# Patient Record
Sex: Male | Born: 2000 | Race: White | Hispanic: No | Marital: Single | Smoking: Never smoker
Health system: Southern US, Community
[De-identification: ages and names within clinical notes are randomized; demographics above are authoritative.]

## PROBLEM LIST (undated history)

## (undated) HISTORY — PX: TONSILLECTOMY: SUR1361

---

## 2021-03-25 ENCOUNTER — Emergency Department (HOSPITAL_COMMUNITY): Payer: No Typology Code available for payment source

## 2021-03-25 ENCOUNTER — Emergency Department (HOSPITAL_COMMUNITY)
Admission: EM | Admit: 2021-03-25 | Discharge: 2021-03-26 | Disposition: A | Discharge status: Home / Self Care | Payer: No Typology Code available for payment source | Attending: Emergency Medicine | Admitting: Emergency Medicine

## 2021-03-25 ENCOUNTER — Other Ambulatory Visit: Payer: Self-pay

## 2021-03-25 ENCOUNTER — Encounter (HOSPITAL_COMMUNITY): Payer: Self-pay | Admitting: Emergency Medicine

## 2021-03-25 DIAGNOSIS — N50812 Left testicular pain: Secondary | ICD-10-CM | POA: Diagnosis not present

## 2021-03-25 DIAGNOSIS — R1030 Lower abdominal pain, unspecified: Secondary | ICD-10-CM | POA: Insufficient documentation

## 2021-03-25 NOTE — ED Provider Notes (Signed)
Emergency Medicine Provider Triage Evaluation Note  Jesse Rose , a 20 y.o. male  was evaluated in triage.  Pt complains of groin and flank pain.  The patient reports worsening groin and flank pain over the last week.  Reports that he had a history of similar several weeks ago and was diagnosed with epididymitis and completed antibiotics.  His symptoms improved, but never fully resolved before they began about a week ago.  Due to worsening symptoms, he went to urgent care tonight and was advised to come to the ER for further work-up and evaluation.  No fever, chills, vomiting, scrotal redness, difficulty urinating, penile discharge.  Review of Systems  Positive: Scrotal pain, flank pain Negative: Fever, chills, difficulty urinating, scrotal redness, vomiting, penile discharge, chest pain, shortness of breath, diarrhea  Physical Exam  BP 122/82   Pulse 70   Temp 97.8 F (36.6 C) (Oral)   Resp 18   Ht 5\' 11"  (1.803 m)   Wt 88.5 kg   SpO2 100%   BMI 27.20 kg/m  Gen:   Awake, no distress   Resp:  Normal effort  MSK:   Moves extremities without difficulty  Other:  Abdomen soft, nontender, nondistended.  No CVA tenderness bilaterally  Medical Decision Making  Medically screening exam initiated at 10:52 PM.  Appropriate orders placed.  Ozil Stettler was informed that the remainder of the evaluation will be completed by another provider, this initial triage assessment does not replace that evaluation, and the importance of remaining in the ED until their evaluation is complete.  Patient presenting with flank and groin pain for the last week.  He has a history of epididymitis.  Patient's labs and imaging have been ordered in the emergency department.  He will require further work-up and evaluation in the ED.   Durward Mallard, PA-C 03/25/21 2310    05/25/21, MD 03/25/21 [id number]

## 2021-03-25 NOTE — ED Triage Notes (Signed)
Pt here for groin pain and R side pain. Pt had this happen 3 weeks ago, was on antibiotics, pain completely went away. Pain started again a week ago. Pt went to UC today, who told him to come to ER to rule out torsion.

## 2021-03-26 LAB — URINALYSIS, ROUTINE W REFLEX MICROSCOPIC
Bilirubin Urine: NEGATIVE
Glucose, UA: NEGATIVE mg/dL
Hgb urine dipstick: NEGATIVE
Ketones, ur: NEGATIVE mg/dL
Leukocytes,Ua: NEGATIVE
Nitrite: NEGATIVE
Protein, ur: NEGATIVE mg/dL
Specific Gravity, Urine: 1.015 (ref 1.005–1.030)
pH: 6 (ref 5.0–8.0)

## 2021-03-26 LAB — CBC WITH DIFFERENTIAL/PLATELET
Abs Immature Granulocytes: 0.02 10*3/uL (ref 0.00–0.07)
Basophils Absolute: 0 10*3/uL (ref 0.0–0.1)
Basophils Relative: 0 %
Eosinophils Absolute: 0.1 10*3/uL (ref 0.0–0.5)
Eosinophils Relative: 1 %
HCT: 48.1 % (ref 39.0–52.0)
Hemoglobin: 16.5 g/dL (ref 13.0–17.0)
Immature Granulocytes: 0 %
Lymphocytes Relative: 27 %
Lymphs Abs: 1.9 10*3/uL (ref 0.7–4.0)
MCH: 31.5 pg (ref 26.0–34.0)
MCHC: 34.3 g/dL (ref 30.0–36.0)
MCV: 92 fL (ref 80.0–100.0)
Monocytes Absolute: 0.7 10*3/uL (ref 0.1–1.0)
Monocytes Relative: 9 %
Neutro Abs: 4.4 10*3/uL (ref 1.7–7.7)
Neutrophils Relative %: 63 %
Platelets: 268 10*3/uL (ref 150–400)
RBC: 5.23 MIL/uL (ref 4.22–5.81)
RDW: 12 % (ref 11.5–15.5)
WBC: 7 10*3/uL (ref 4.0–10.5)
nRBC: 0 % (ref 0.0–0.2)

## 2021-03-26 LAB — COMPREHENSIVE METABOLIC PANEL
ALT: 22 U/L (ref 0–44)
AST: 28 U/L (ref 15–41)
Albumin: 4.2 g/dL (ref 3.5–5.0)
Alkaline Phosphatase: 77 U/L (ref 38–126)
Anion gap: 10 (ref 5–15)
BUN: 23 mg/dL — ABNORMAL HIGH (ref 6–20)
CO2: 26 mmol/L (ref 22–32)
Calcium: 9.6 mg/dL (ref 8.9–10.3)
Chloride: 99 mmol/L (ref 98–111)
Creatinine, Ser: 1.02 mg/dL (ref 0.61–1.24)
GFR, Estimated: >60 mL/min (ref >60)
Glucose, Bld: 99 mg/dL (ref 70–99)
Potassium: 3.6 mmol/L (ref 3.5–5.1)
Sodium: 135 mmol/L (ref 135–145)
Total Bilirubin: 0.9 mg/dL (ref 0.3–1.2)
Total Protein: 7.2 g/dL (ref 6.5–8.1)

## 2021-03-26 LAB — GC/CHLAMYDIA PROBE AMP (~~LOC~~) NOT AT ARMC
Chlamydia: NEGATIVE
Comment: NEGATIVE
Comment: NORMAL
Neisseria Gonorrhea: NEGATIVE

## 2021-03-26 NOTE — Discharge Instructions (Addendum)
Use Tylenol every 4 hours and ibuprofen every 6 hours as needed for pain.  Minimize running and jumping until you see urology. Return for worsening or persistent pain or swelling in the testicle.

## 2021-03-26 NOTE — ED Notes (Signed)
Patient given discharge instructions. Questions were answered. Patient verbalized understanding of discharge instructions and care at home.  

## 2021-03-26 NOTE — ED Provider Notes (Addendum)
[facility] EMERGENCY DEPARTMENT Provider Note   CSN: [id number] Arrival date & time: 03/25/21  2156     History Chief Complaint  Patient presents with   Groin Pain   Flank Pain    Jesse Rose is a 20 y.o. male.  Patient reports intermittent testicular pain that started about 4 weeks ago.  He reports mostly left-sided testicular pain but will sometimes have pain on the right side.  Pain occasionally radiates up to his flank.  Pain occurs multiple times a day and will last about 1 to 2 minutes and this described as a throbbing discomfort, rated 4 or 5 out of 10.  Patient had initially noted some swelling in his testicle and states he was diagnosed with epididymitis, for which he received antibiotics.  He states the pain improved with antibiotics but never went away completely and his symptoms worsened again about 1 to 2 weeks ago.  Denies fever, chills, dysuria, urinary frequency, penile discharge.  Patient last had sexual intercourse about 1-1/2 months ago.  He was seen at urgent care prior and was told to come to the ED for further evaluation.  The history is provided by the patient. No language interpreter was used.  Groin Pain This is a new problem. The current episode started more than 1 week ago. The problem occurs daily. The problem has not changed since onset.Pertinent negatives include no abdominal pain. Nothing aggravates the symptoms. Nothing relieves the symptoms. He has tried nothing for the symptoms.  Flank Pain This is a new problem. The current episode started more than 1 week ago. The problem occurs daily. The problem has not changed since onset.Pertinent negatives include no abdominal pain. Nothing aggravates the symptoms. Nothing relieves the symptoms. He has tried nothing for the symptoms.      History reviewed. No pertinent past medical history.  There are no problems to display for this patient.   Past Surgical History:  Procedure Laterality Date    TONSILLECTOMY         History reviewed. No pertinent family history.  Social History   Tobacco Use   Smoking status: Never   Smokeless tobacco: Never  Substance Use Topics   Alcohol use: Yes   Drug use: Not Currently    Home Medications Prior to Admission medications   Medication Sig Start Date End Date Taking? Authorizing Provider  albuterol (VENTOLIN HFA) 108 (90 Base) MCG/ACT inhaler Inhale 2 puffs into the lungs as needed. 10/17/20  Yes [provider]    Allergies    Patient has no known allergies.  Review of Systems   Review of Systems  Constitutional:  Negative for chills and fever.  Gastrointestinal:  Negative for abdominal pain.  Genitourinary:  Positive for flank pain, scrotal swelling and testicular pain. Negative for dysuria, frequency, hematuria and penile discharge.  Musculoskeletal:  Negative for back pain.   Physical Exam Updated Vital Signs BP 133/76   Pulse (!) 43   Temp 97.9 F (36.6 C)   Resp 15   Ht 5\' 11"  (1.803 m)   Wt 88.5 kg   SpO2 99%   BMI 27.20 kg/m   Physical Exam Vitals and nursing note reviewed. Exam conducted with a chaperone present.  Constitutional:      General: He is not in acute distress.    Appearance: Normal appearance. He is well-developed. He is not ill-appearing.  HENT:     Head: Normocephalic and atraumatic.  Eyes:     Extraocular Movements: Extraocular  movements intact.  Cardiovascular:     Rate and Rhythm: Normal rate and regular rhythm.     Heart sounds: No murmur heard. Pulmonary:     Effort: Pulmonary effort is normal. No respiratory distress.     Breath sounds: Normal breath sounds.  Abdominal:     Palpations: Abdomen is soft.     Tenderness: There is no abdominal tenderness.     Hernia: There is no hernia in the left inguinal area or right inguinal area.  Genitourinary:    Penis: Normal. No tenderness, discharge, swelling or lesions.      Testes: Normal.        Right: Mass, tenderness or  swelling not present.        Left: Mass, tenderness or swelling not present.     Epididymis:     Right: No tenderness.     Left: No tenderness.  Musculoskeletal:     Cervical back: Neck supple.  Skin:    General: Skin is warm and dry.  Neurological:     Mental Status: He is alert.    ED Results / Procedures / Treatments   Labs (all labs ordered are listed, but only abnormal results are displayed) Labs Reviewed  COMPREHENSIVE METABOLIC PANEL - Abnormal; Notable for the following components:      Result Value   BUN 23 (*)    All other components within normal limits  CBC WITH DIFFERENTIAL/PLATELET  URINALYSIS, ROUTINE W REFLEX MICROSCOPIC  GC/CHLAMYDIA PROBE AMP ([place]) NOT AT [facility]    EKG None  Radiology US SCROTUM W/DOPPLER  Result Date: 03/25/2021 CLINICAL DATA:  Testicular pain EXAM: SCROTAL ULTRASOUND DOPPLER ULTRASOUND OF THE TESTICLES TECHNIQUE: Complete ultrasound examination of the testicles, epididymis, and other scrotal structures was performed. Color and spectral Doppler ultrasound were also utilized to evaluate blood flow to the testicles. COMPARISON:  None. FINDINGS: Right testicle Measurements: 4.0 x 2.4 x 2.7 cm. No mass or microlithiasis visualized. Left testicle Measurements: 4.4 x 2.3 x 3.1 cm. No mass or microlithiasis visualized. Right epididymis:  Normal in size and appearance. Left epididymis:  Normal in size and appearance. Hydrocele: Small fluid is seen within the scrotal sac bilaterally, likely physiologic. Varicocele:  Small left varicocele is present. Pulsed Doppler interrogation of both testes demonstrates normal low resistance arterial and venous waveforms bilaterally. IMPRESSION: Small left varicocele. Normal examination of the testes. Electronically Signed   By: Helyn Numbers MD   On: 03/25/2021 23:48    Procedures Procedures   Medications Ordered in ED Medications - No data to display  ED Course  I have reviewed the triage vital signs and  the nursing notes.  Pertinent labs & imaging results that were available during my care of the patient were reviewed by me and considered in my medical decision making (see chart for details).    MDM Rules/Calculators/A&P                         20 year old male with no significant past medical history presenting with intermittent mild testicular pain ongoing for 4 weeks, s/p antibiotics for epididymitis without resolution of symptoms.  Labs unremarkable, scrotal ultrasound notable only for small left varicocele and good blood flow in the testicles.  No symptoms of STI, urine GC/chlamydia pending.  At this point, unclear etiology of testicular pain.  Could consider intermittent testicular torsion though good blood flow noted on scrotal ultrasound.  Infection less likely without any signs of infection.  Recommended  neurology follow-up, [facility] information given though [place].  Patient is stable for discharge at this time.   Final Clinical Impression(s) / ED Diagnoses Final diagnoses:  Left testicular pain    Rx / DC Orders ED Discharge Orders     None        Littie Deeds, MD 03/26/21 1610    Littie Deeds, MD 03/26/21 1029    Blane Ohara, MD 03/26/21 1558

## 2022-04-02 IMAGING — US US SCROTUM W/ DOPPLER COMPLETE
1 series · 14 of 25 positions shown · non-contrast
Comparison: None.

CLINICAL DATA: Testicular pain

EXAM:
SCROTAL ULTRASOUND
DOPPLER ULTRASOUND OF THE TESTICLES
TECHNIQUE: Complete ultrasound examination of the testicles, epididymis, and
other scrotal structures was performed. Color and spectral Doppler
ultrasound were also utilized to evaluate blood flow to the
testicles.

[Series 1: us scrotum w/doppler · 14 of 62 slices shown]
[im 1/62]
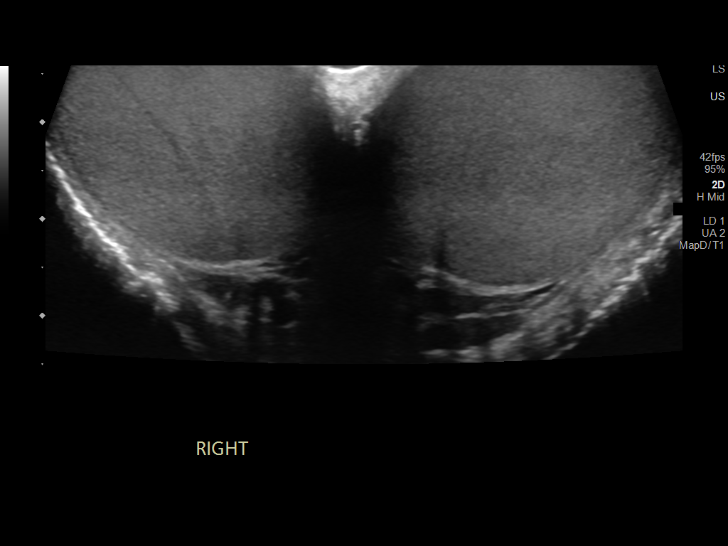
[im 6/62]
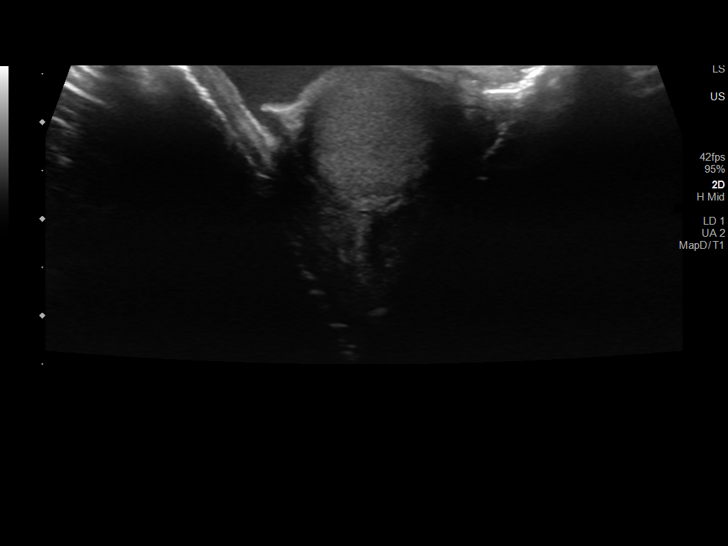
[im 11/62]
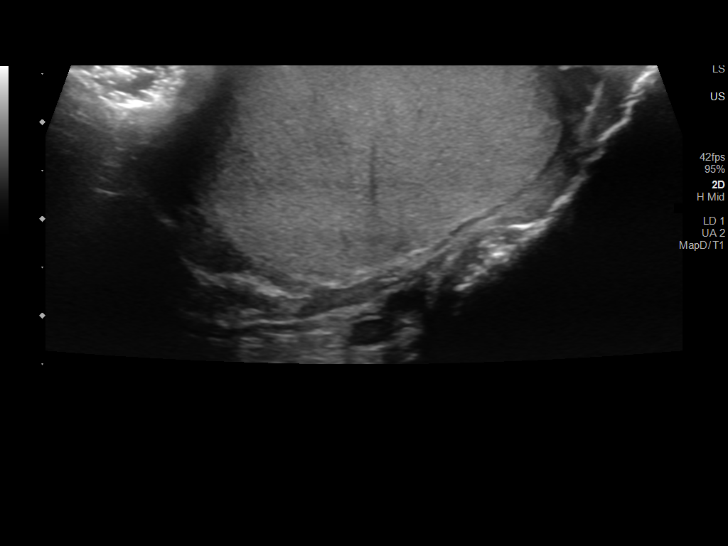
[im 16/62]
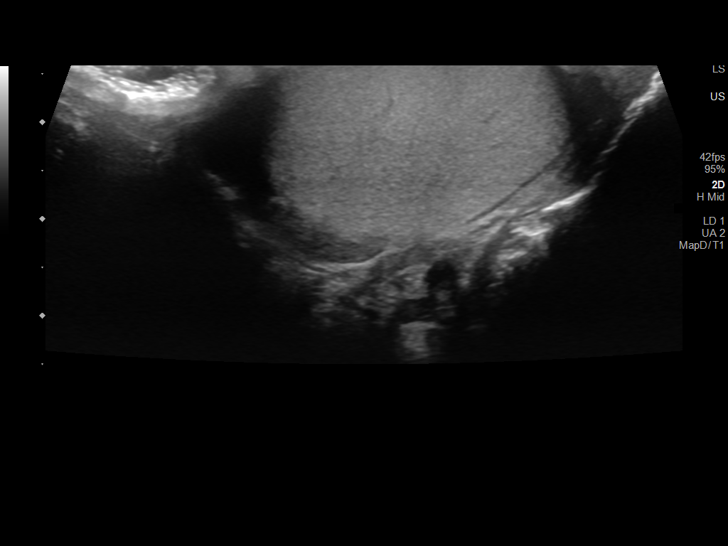
[im 21/62]
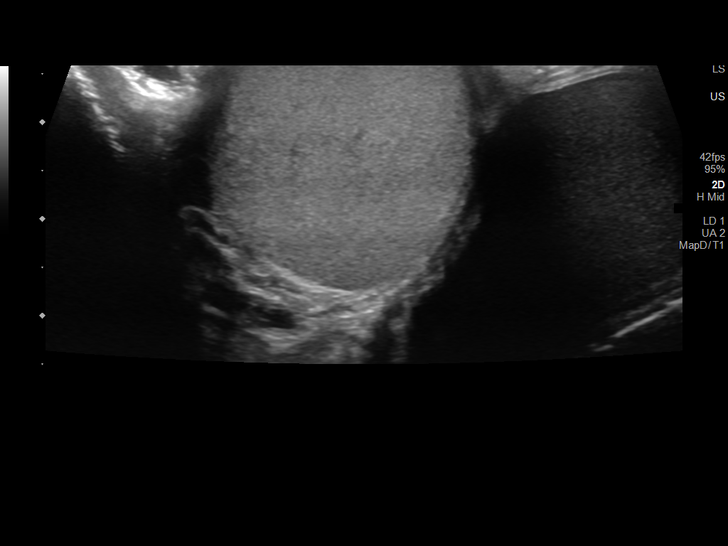
[im 23/62]
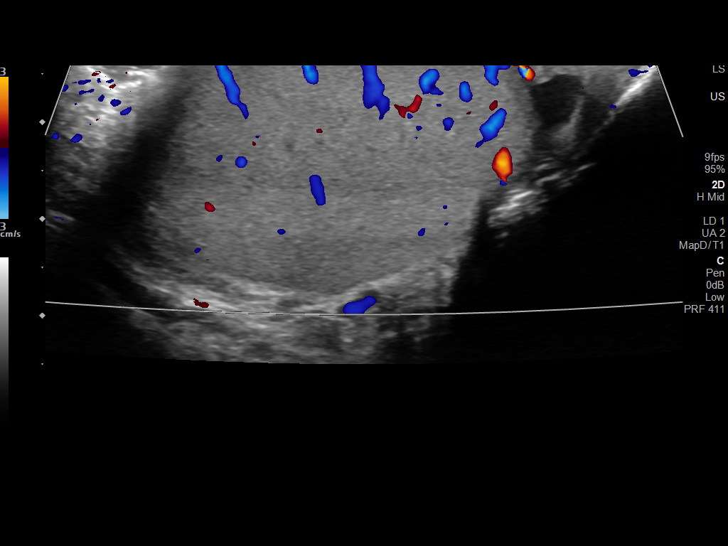
[im 28/62]
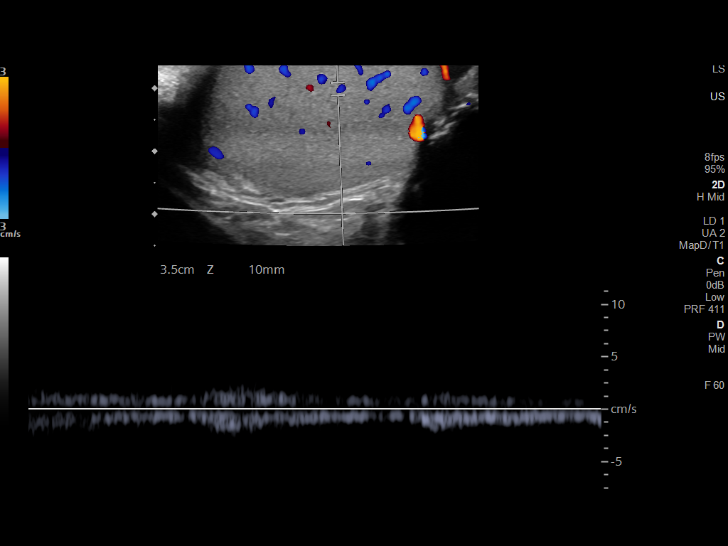
[im 34/62]
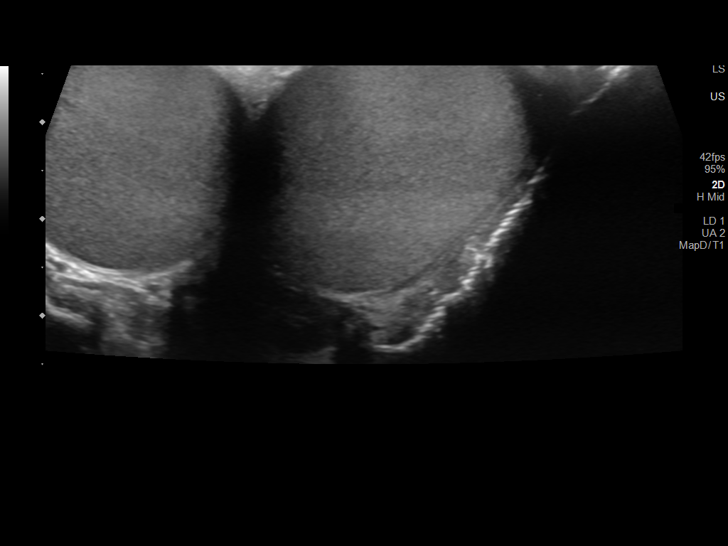
[im 39/62]
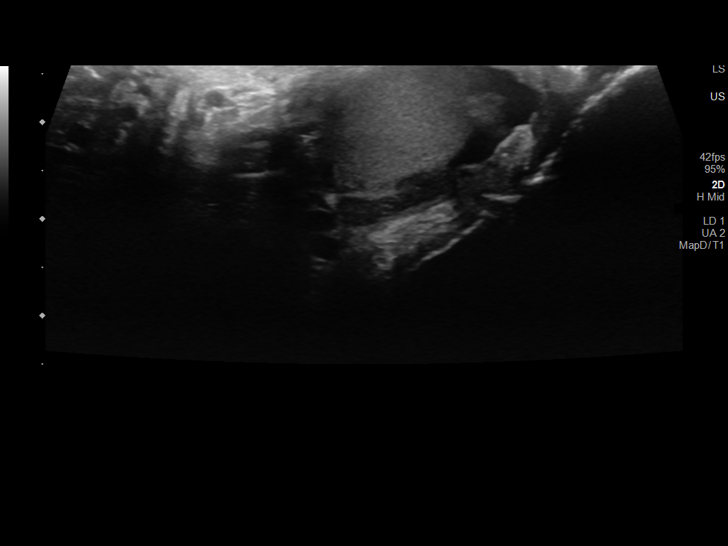
[im 41/62]
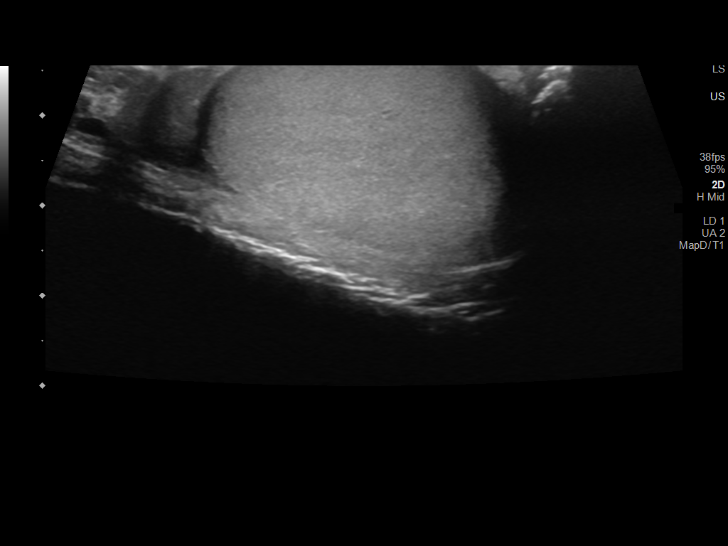
[im 46/62]
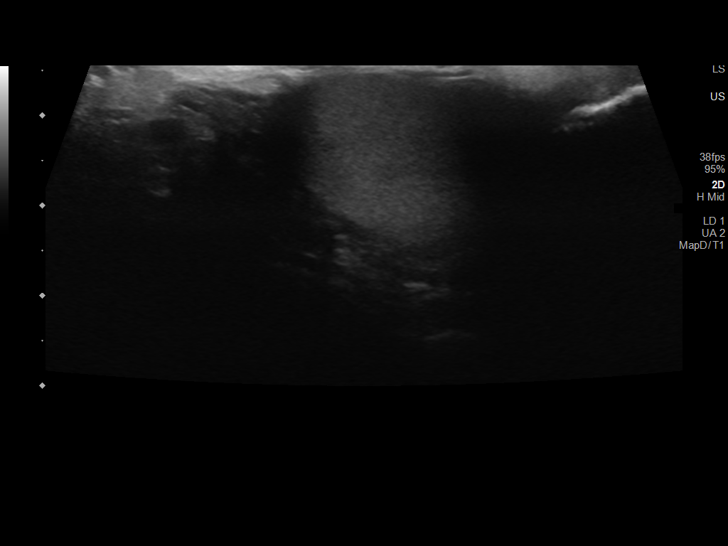
[im 51/62]
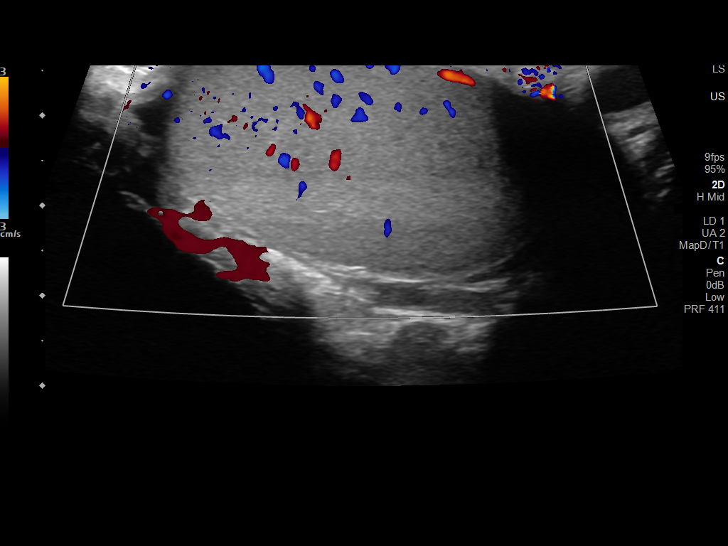
[im 56/62]
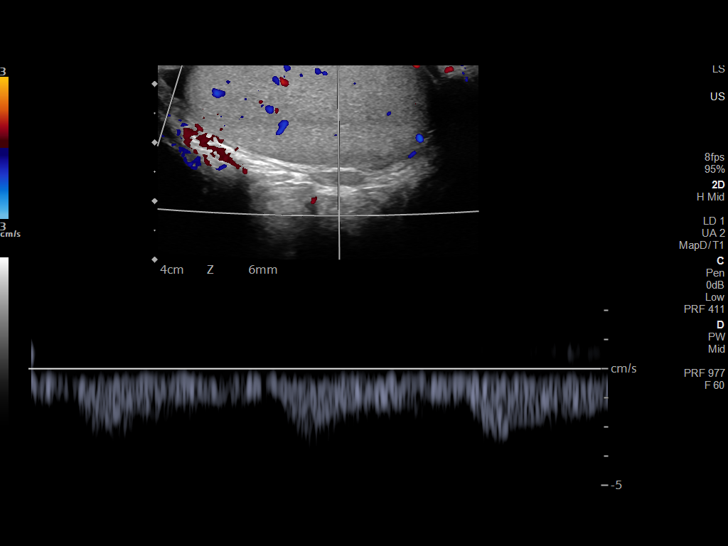
[im 62/62]
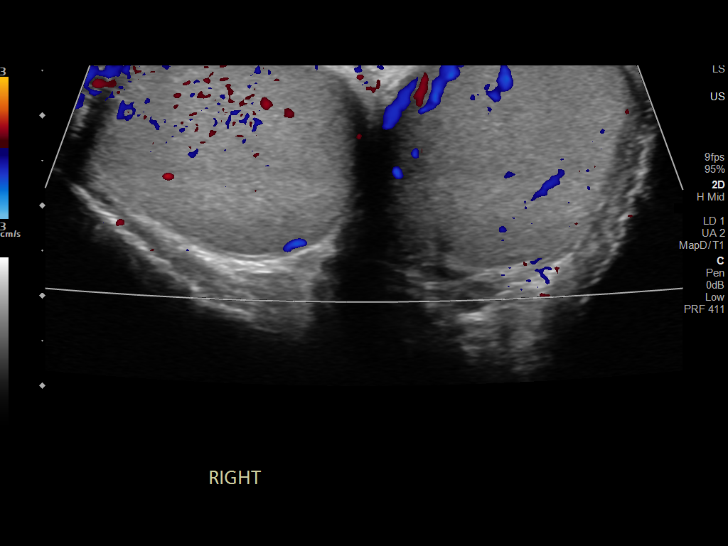

[14 of 25 positions shown; findings below may reference images not displayed]

FINDINGS: Right testicle

Measurements: 4.0 x 2.4 x 2.7 cm. No mass or microlithiasis
visualized.

Left testicle

Measurements: 4.4 x 2.3 x 3.1 cm. No mass or microlithiasis
visualized.

Right epididymis:  Normal in size and appearance.

Left epididymis:  Normal in size and appearance.

Hydrocele: Small fluid is seen within the scrotal sac bilaterally,
likely physiologic.

Varicocele:  Small left varicocele is present.

Pulsed Doppler interrogation of both testes demonstrates normal low
resistance arterial and venous waveforms bilaterally.
IMPRESSION: Small left varicocele.

Normal examination of the testes.
